# Patient Record
Sex: Female | Born: 1975
Health system: Southern US, Community
[De-identification: ages and names within clinical notes are randomized; demographics above are authoritative.]

## PROBLEM LIST (undated history)

## (undated) DIAGNOSIS — Z9889 Other specified postprocedural states: Secondary | ICD-10-CM

## (undated) DIAGNOSIS — Z90710 Acquired absence of both cervix and uterus: Secondary | ICD-10-CM

## (undated) DIAGNOSIS — R112 Nausea with vomiting, unspecified: Secondary | ICD-10-CM

## (undated) DIAGNOSIS — Z98891 History of uterine scar from previous surgery: Secondary | ICD-10-CM

## (undated) HISTORY — PX: ABDOMINAL HYSTERECTOMY: SHX81

---

## 2018-08-10 DIAGNOSIS — Z23 Encounter for immunization: Secondary | ICD-10-CM | POA: Diagnosis not present

## 2018-08-10 DIAGNOSIS — Z13220 Encounter for screening for lipoid disorders: Secondary | ICD-10-CM | POA: Diagnosis not present

## 2018-08-10 DIAGNOSIS — Z0000 Encounter for general adult medical examination without abnormal findings: Secondary | ICD-10-CM | POA: Diagnosis not present

## 2018-08-10 DIAGNOSIS — E669 Obesity, unspecified: Secondary | ICD-10-CM | POA: Diagnosis not present

## 2018-11-11 DIAGNOSIS — T7840XA Allergy, unspecified, initial encounter: Secondary | ICD-10-CM | POA: Diagnosis not present

## 2018-11-11 DIAGNOSIS — R21 Rash and other nonspecific skin eruption: Secondary | ICD-10-CM | POA: Diagnosis not present

## 2020-03-21 ENCOUNTER — Ambulatory Visit (INDEPENDENT_AMBULATORY_CARE_PROVIDER_SITE_OTHER): Payer: 59 | Admitting: Podiatry

## 2020-03-21 ENCOUNTER — Encounter: Payer: Self-pay | Admitting: Podiatry

## 2020-03-21 ENCOUNTER — Other Ambulatory Visit: Payer: Self-pay

## 2020-03-21 DIAGNOSIS — L600 Ingrowing nail: Secondary | ICD-10-CM | POA: Diagnosis not present

## 2020-03-21 MED ORDER — NEOMYCIN-POLYMYXIN-HC 1 % OT SOLN
OTIC | 1 refills | Status: DC
Start: 2020-03-21 — End: 2020-11-19

## 2020-03-21 NOTE — Patient Instructions (Signed)

## 2020-03-21 NOTE — Progress Notes (Signed)
  Subjective:  Patient ID: Brittney Zimmerman, female    DOB: 1975-10-14,  MRN: [id number] HPI Chief Complaint  Patient presents with  . Toe Pain    Hallux right - medial border, tender, red, swollen intermittently x years, worsened in the last 2 weeks, been trimming and using neosporin  . New Patient (Initial Visit)    44 y.o. female presents with the above complaint.   ROS: Denies fever chills nausea vomiting muscle aches pains calf pain back pain chest pain shortness of breath.  No past medical history on file.   Current Outpatient Medications:  .  NEOMYCIN-POLYMYXIN-HYDROCORTISONE (CORTISPORIN) 1 % SOLN OTIC solution, Apply 1-2 drops to toe BID after soaking, Disp: 10 mL, Rfl: 1  No Known Allergies Review of Systems Objective:  There were no vitals filed for this visit.  General: Well developed, nourished, in no acute distress, alert and oriented x3   Dermatological: Skin is warm, dry and supple bilateral. Nails x 10 are well maintained; remaining integument appears unremarkable at this time. There are no open sores, no preulcerative lesions, no rash or signs of infection present.  Ingrown toenail tibial border hallux right.  Dried abscesses noted to the distal medial aspect.  Moderate erythema no cellulitis drainage or odor.  Vascular: Dorsalis Pedis artery and Posterior Tibial artery pedal pulses are 2/4 bilateral with immedate capillary fill time. Pedal hair growth present. No varicosities and no lower extremity edema present bilateral.   Neruologic: Grossly intact via light touch bilateral. Vibratory intact via tuning fork bilateral. Protective threshold with Semmes Wienstein monofilament intact to all pedal sites bilateral. Patellar and Achilles deep tendon reflexes 2+ bilateral. No Babinski or clonus noted bilateral.   Musculoskeletal: No gross boney pedal deformities bilateral. No pain, crepitus, or limitation noted with foot and ankle range of motion bilateral. Muscular  strength 5/5 in all groups tested bilateral.  Gait: Unassisted, Nonantalgic.    Radiographs:  None taken  Assessment & Plan:   Assessment: Ingrown nail tibial border hallux right  Plan: Chemical matricectomy was performed today after local anesthetic was administered.  She tolerated procedure well without complications.  She was given both oral and written wound instructions for care and soaking of the toe as well as a prescription for Corticosporin otic will follow up with me in 2 weeks.     Niranjan Rufener T. [place], [place]

## 2020-04-04 ENCOUNTER — Ambulatory Visit (INDEPENDENT_AMBULATORY_CARE_PROVIDER_SITE_OTHER): Payer: 59 | Admitting: Podiatry

## 2020-04-04 ENCOUNTER — Other Ambulatory Visit: Payer: Self-pay

## 2020-04-04 DIAGNOSIS — L600 Ingrowing nail: Secondary | ICD-10-CM

## 2020-04-04 NOTE — Progress Notes (Signed)
She presents today for follow-up of her matrixectomy right hallux.  States that she has been soaking Epson salts and warm water.  Objective: Vital signs are stable alert oriented x3.  There is no erythema edema cellulitis drainage or odor.  Assessment: Well-healing surgical toe.  Plan: Follow-up with me on an as-needed basis.

## 2020-04-08 ENCOUNTER — Emergency Department: Payer: 59

## 2020-04-08 ENCOUNTER — Other Ambulatory Visit: Payer: Self-pay

## 2020-04-08 ENCOUNTER — Emergency Department
Admission: EM | Admit: 2020-04-08 | Discharge: 2020-04-08 | Disposition: A | Discharge status: Home / Self Care | Payer: 59 | Attending: Emergency Medicine | Admitting: Emergency Medicine

## 2020-04-08 DIAGNOSIS — K8020 Calculus of gallbladder without cholecystitis without obstruction: Secondary | ICD-10-CM | POA: Insufficient documentation

## 2020-04-08 DIAGNOSIS — R1011 Right upper quadrant pain: Secondary | ICD-10-CM

## 2020-04-08 DIAGNOSIS — K8050 Calculus of bile duct without cholangitis or cholecystitis without obstruction: Secondary | ICD-10-CM | POA: Diagnosis not present

## 2020-04-08 LAB — CBC
HCT: 37.8 % (ref 36.0–46.0)
Hemoglobin: 13.2 g/dL (ref 12.0–15.0)
MCH: 28.4 pg (ref 26.0–34.0)
MCHC: 34.9 g/dL (ref 30.0–36.0)
MCV: 81.3 fL (ref 80.0–100.0)
Platelets: 227 10*3/uL (ref 150–400)
RBC: 4.65 MIL/uL (ref 3.87–5.11)
RDW: 13.5 % (ref 11.5–15.5)
WBC: 7.5 10*3/uL (ref 4.0–10.5)
nRBC: 0 % (ref 0.0–0.2)

## 2020-04-08 LAB — COMPREHENSIVE METABOLIC PANEL
ALT: 16 U/L (ref 0–44)
AST: 19 U/L (ref 15–41)
Albumin: 4.1 g/dL (ref 3.5–5.0)
Alkaline Phosphatase: 56 U/L (ref 38–126)
Anion gap: 9 (ref 5–15)
BUN: 10 mg/dL (ref 6–20)
CO2: 23 mmol/L (ref 22–32)
Calcium: 8.7 mg/dL — ABNORMAL LOW (ref 8.9–10.3)
Chloride: 107 mmol/L (ref 98–111)
Creatinine, Ser: 0.66 mg/dL (ref 0.44–1.00)
GFR calc Af Amer: >60 mL/min (ref >60)
GFR calc non Af Amer: >60 mL/min (ref >60)
Glucose, Bld: 114 mg/dL — ABNORMAL HIGH (ref 70–99)
Potassium: 3.7 mmol/L (ref 3.5–5.1)
Sodium: 139 mmol/L (ref 135–145)
Total Bilirubin: 0.6 mg/dL (ref 0.3–1.2)
Total Protein: 7.8 g/dL (ref 6.5–8.1)

## 2020-04-08 LAB — LIPASE, BLOOD: Lipase: 24 U/L (ref 11–51)

## 2020-04-08 MED ORDER — ONDANSETRON 4 MG PO TBDP
4.0000 mg | ORAL_TABLET | Freq: Three times a day (TID) | ORAL | 0 refills | Status: DC | PRN
Start: 2020-04-08 — End: 2020-11-19

## 2020-04-08 MED ORDER — OXYCODONE-ACETAMINOPHEN 5-325 MG PO TABS
1.0000 | ORAL_TABLET | Freq: Once | ORAL | Status: AC
  Administered 2020-04-08: 1 via ORAL
  Filled 2020-04-08: qty 1

## 2020-04-08 MED ORDER — OXYCODONE-ACETAMINOPHEN 5-325 MG PO TABS: 1.0000 | ORAL_TABLET | ORAL | 0 refills | Status: DC | PRN

## 2020-04-08 MED ORDER — ONDANSETRON 4 MG PO TBDP
4.0000 mg | ORAL_TABLET | Freq: Once | ORAL | Status: AC
  Administered 2020-04-08: 4 mg via ORAL
  Filled 2020-04-08: qty 1

## 2020-04-08 NOTE — Discharge Instructions (Addendum)
1. Take medicines as needed for pain & nausea (Percocet/Zofran #30). 2. Clear liquids x 12 hours, then bland diet x 1 week, then slowly advance diet as tolerated. Avoid fatty, greasy, spicy foods and drinks. 3. Return to the ER for worsening symptoms, persistent vomiting, fever, difficulty breathing or other concerns.  

## 2020-04-08 NOTE — ED Notes (Signed)
Reviewed discharge instructions, follow-up care, and prescriptions with patient. Patient verbalized understanding of all information reviewed. Patient stable, with no distress noted at this time.    

## 2020-04-08 NOTE — ED Triage Notes (Signed)
Pt with right upper quadrant pain and vomiting that began at [place]. Pt denies fever or diarrhea. Pt states last bm two hours ago.

## 2020-04-08 NOTE — ED Provider Notes (Signed)
[facility] Emergency Department Provider Note   ____________________________________________   First MD Initiated Contact with Patient 04/08/20 [id number]     (approximate)  I have reviewed the triage vital signs and the nursing notes.   HISTORY  Chief Complaint Abdominal Pain    HPI Brittney Zimmerman is a 44 y.o. female who presents to the ED from home with a chief complaint of right upper quadrant abdominal pain and vomiting which began approximately 0030.  Patient had buttered popcorn and pizza for dinner.  Awoke with the above symptoms.  Denies fever, chills, chest pain, shortness of breath, dysuria or diarrhea.  Denies recent travel or trauma.  No similar symptoms previously.      Past medical history None  There are no problems to display for this patient.    Prior to Admission medications   Medication Sig Start Date End Date Taking? Authorizing Provider  NEOMYCIN-POLYMYXIN-HYDROCORTISONE (CORTISPORIN) 1 % SOLN OTIC solution Apply 1-2 drops to toe BID after soaking 03/21/20   Hyatt, Max T, DPM  ondansetron (ZOFRAN ODT) 4 MG disintegrating tablet Take 1 tablet (4 mg total) by mouth every 8 (eight) hours as needed for nausea or vomiting. 04/08/20   Paulette Blanch, MD  oxyCODONE-acetaminophen (PERCOCET/ROXICET) 5-325 MG tablet Take 1 tablet by mouth every 4 (four) hours as needed for severe pain. 04/08/20   Paulette Blanch, MD    Allergies Patient has no known allergies.  No family history on file.  Social History Social History   Tobacco Use   Smoking status: Never Smoker   Smokeless tobacco: Never Used  Substance Use Topics   Alcohol use: Not Currently   Drug use: Not on file    Review of Systems  Constitutional: No fever/chills Eyes: No visual changes. ENT: No sore throat. Cardiovascular: Denies chest pain. Respiratory: Denies shortness of breath. Gastrointestinal: Positive for right upper quadrant abdominal pain, nausea and vomiting.   No diarrhea.  No constipation. Genitourinary: Negative for dysuria. Musculoskeletal: Negative for back pain. Skin: Negative for rash. Neurological: Negative for headaches, focal weakness or numbness.   ____________________________________________   PHYSICAL EXAM:  VITAL SIGNS: ED Triage Vitals  Enc Vitals Group     BP 04/08/20 0249 (!) 150/85     Pulse Rate 04/08/20 0249 74     Resp 04/08/20 0249 20     Temp 04/08/20 0249 98 F (36.7 C)     Temp Source 04/08/20 0249 Oral     SpO2 04/08/20 0249 97 %     Weight 04/08/20 0250 250 lb (113.4 kg)     Height 04/08/20 0250 5\' 4"  (1.626 m)     Head Circumference --      Peak Flow --      Pain Score 04/08/20 0250 7     Pain Loc --      Pain Edu? --      Excl. in [place]? --     Constitutional: Alert and oriented. Well appearing and in no acute distress. Eyes: Conjunctivae are normal. PERRL. EOMI. Head: Atraumatic. Nose: No congestion/rhinnorhea. Mouth/Throat: Mucous membranes are moist.   Neck: No stridor.   Cardiovascular: Normal rate, regular rhythm. Grossly normal heart sounds.  Good peripheral circulation. Respiratory: Normal respiratory effort.  No retractions. Lungs CTAB. Gastrointestinal: Soft and minimally tender to palpation right upper quadrant without rebound or guarding. No distention. No abdominal bruits. No CVA tenderness. Musculoskeletal: No lower extremity tenderness nor edema.  No joint effusions. Neurologic:  Normal speech and language.  No gross focal neurologic deficits are appreciated. No gait instability. Skin:  Skin is warm, dry and intact. No rash noted. Psychiatric: Mood and affect are normal. Speech and behavior are normal.  ____________________________________________   LABS (all labs ordered are listed, but only abnormal results are displayed)  Labs Reviewed  COMPREHENSIVE METABOLIC PANEL - Abnormal; Notable for the following components:      Result Value   Glucose, Bld 114 (*)    Calcium 8.7 (*)     All other components within normal limits  LIPASE, BLOOD  CBC  URINALYSIS, COMPLETE (UACMP) WITH MICROSCOPIC   ____________________________________________  EKG  ED ECG REPORT I, Godwin Tedesco J, the attending physician, personally viewed and interpreted this ECG.   Date: 04/08/2020  EKG Time: 0254  Rate: 69  Rhythm: normal EKG, normal sinus rhythm  Axis: Normal  Intervals:none  ST&T Change: Nonspecific  ____________________________________________  RADIOLOGY  ED MD interpretation: Cholelithiasis without cholecystitis or biliary ductal dilatation  Official radiology report(s): US Abdomen Limited  Result Date: 04/08/2020 CLINICAL DATA:  Right upper quadrant pain and emesis EXAM: ULTRASOUND ABDOMEN LIMITED RIGHT UPPER QUADRANT COMPARISON:  None. FINDINGS: Gallbladder: Gallbladder contains multiple echogenic shadowing gallstones and more hypoechoic biliary sludge. Largest gallstone measures up to 1.2 cm in maximal diameter. No significant gallbladder wall thickening or pericholecystic fluid. Sonographic Percell Miller sign is reportedly negative. Common bile duct: Diameter: 4.5 mm, nondilated Liver: Mildly increased hepatic echogenicity with loss of definition of the portal triads compatible with hepatic steatosis. Portal vein is patent on color Doppler imaging with normal direction of blood flow towards the liver. Other: None. IMPRESSION: Cholelithiasis. No sonographic evidence of acute cholecystitis or biliary ductal dilatation. Slightly increased hepatic echogenicity, most likely reflective of mild hepatic steatosis. Electronically Signed   By: Lovena Le M.D.   On: 04/08/2020 03:24    ____________________________________________   PROCEDURES  Procedure(s) performed (including Critical Care):  Procedures   ____________________________________________   INITIAL IMPRESSION / ASSESSMENT AND PLAN / ED COURSE  As part of my medical decision making, I reviewed the following data within  the [place] History obtained from family, Nursing notes reviewed and incorporated, Labs reviewed, Old chart reviewed, Radiograph reviewed, Notes from prior ED visits and [place] Controlled Substance Database     Brittney Zimmerman was evaluated in Emergency Department on 04/08/2020 for the symptoms described in the history of present illness. She was evaluated in the context of the global COVID-19 pandemic, which necessitated consideration that the patient might be at risk for infection with the SARS-CoV-2 virus that causes COVID-19. Institutional protocols and algorithms that pertain to the evaluation of patients at risk for COVID-19 are in a state of rapid change based on information released by regulatory bodies including the CDC and federal and state organizations. These policies and algorithms were followed during the patient's care in the ED.    44 year old female presenting with upper abdominal pain and vomiting. Differential diagnosis includes, but is not limited to, biliary disease (biliary colic, acute cholecystitis, cholangitis, choledocholithiasis, etc), intrathoracic causes for epigastric abdominal pain including ACS, gastritis, duodenitis, pancreatitis, small bowel or large bowel obstruction, abdominal aortic aneurysm, hernia, and ulcer(s).  Laboratory results demonstrate normal LFTs and lipase.  Ultrasound demonstrates cholelithiasis without cholecystitis.  Patient pain-free at present.  Will discharge home on Percocet and Zofran to use as needed, gallbladder diet and surgery follow-up.  Strict return precautions given.  Patient and spouse verbalized understanding agree with plan of care.      ____________________________________________  FINAL CLINICAL IMPRESSION(S) / ED DIAGNOSES  Final diagnoses:  Biliary colic  Calculus of gallbladder without cholecystitis without obstruction  Right upper quadrant abdominal pain     ED Discharge Orders         Ordered     oxyCODONE-acetaminophen (PERCOCET/ROXICET) 5-325 MG tablet  Every 4 hours PRN     Discontinue  Reprint     04/08/20 0553    ondansetron (ZOFRAN ODT) 4 MG disintegrating tablet  Every 8 hours PRN     Discontinue  Reprint     04/08/20 0553           Note:  This document was prepared using Dragon voice recognition software and may include unintentional dictation errors.   Paulette Blanch, MD 04/08/20 [phone]

## 2020-07-24 ENCOUNTER — Other Ambulatory Visit: Payer: Self-pay | Admitting: Family Medicine

## 2020-07-24 DIAGNOSIS — Z1231 Encounter for screening mammogram for malignant neoplasm of breast: Secondary | ICD-10-CM

## 2020-08-30 ENCOUNTER — Other Ambulatory Visit: Payer: Self-pay

## 2020-08-30 ENCOUNTER — Ambulatory Visit
Admission: RE | Admit: 2020-08-30 | Discharge: 2020-08-30 | Disposition: A | Discharge status: Home / Self Care | Payer: 59 | Source: Ambulatory Visit | Attending: Family Medicine | Admitting: Family Medicine

## 2020-08-30 DIAGNOSIS — Z1231 Encounter for screening mammogram for malignant neoplasm of breast: Secondary | ICD-10-CM | POA: Diagnosis present

## 2020-09-05 ENCOUNTER — Other Ambulatory Visit: Payer: Self-pay | Admitting: Family Medicine

## 2020-09-10 ENCOUNTER — Other Ambulatory Visit: Payer: Self-pay | Admitting: Family Medicine

## 2020-09-10 DIAGNOSIS — R928 Other abnormal and inconclusive findings on diagnostic imaging of breast: Secondary | ICD-10-CM

## 2020-09-10 DIAGNOSIS — N6310 Unspecified lump in the right breast, unspecified quadrant: Secondary | ICD-10-CM

## 2020-09-18 ENCOUNTER — Ambulatory Visit
Admission: RE | Admit: 2020-09-18 | Discharge: 2020-09-18 | Disposition: A | Discharge status: Home / Self Care | Payer: 59 | Source: Ambulatory Visit | Attending: Family Medicine | Admitting: Family Medicine

## 2020-09-18 ENCOUNTER — Other Ambulatory Visit: Payer: Self-pay

## 2020-09-18 DIAGNOSIS — R928 Other abnormal and inconclusive findings on diagnostic imaging of breast: Secondary | ICD-10-CM

## 2020-09-18 DIAGNOSIS — N6310 Unspecified lump in the right breast, unspecified quadrant: Secondary | ICD-10-CM | POA: Insufficient documentation

## 2020-09-20 ENCOUNTER — Other Ambulatory Visit: Payer: Self-pay | Admitting: Family Medicine

## 2020-09-20 DIAGNOSIS — N6320 Unspecified lump in the left breast, unspecified quadrant: Secondary | ICD-10-CM

## 2020-09-20 DIAGNOSIS — R928 Other abnormal and inconclusive findings on diagnostic imaging of breast: Secondary | ICD-10-CM

## 2020-09-24 ENCOUNTER — Other Ambulatory Visit: Payer: Self-pay | Admitting: Family Medicine

## 2020-09-24 DIAGNOSIS — N6310 Unspecified lump in the right breast, unspecified quadrant: Secondary | ICD-10-CM

## 2020-09-24 DIAGNOSIS — R928 Other abnormal and inconclusive findings on diagnostic imaging of breast: Secondary | ICD-10-CM

## 2020-09-27 ENCOUNTER — Ambulatory Visit
Admission: RE | Admit: 2020-09-27 | Discharge: 2020-09-27 | Disposition: A | Discharge status: Home / Self Care | Payer: 59 | Source: Ambulatory Visit | Attending: Family Medicine | Admitting: Family Medicine

## 2020-09-27 ENCOUNTER — Other Ambulatory Visit: Payer: 59

## 2020-09-27 ENCOUNTER — Ambulatory Visit: Payer: 59

## 2020-09-27 ENCOUNTER — Other Ambulatory Visit: Payer: Self-pay

## 2020-09-27 DIAGNOSIS — N6310 Unspecified lump in the right breast, unspecified quadrant: Secondary | ICD-10-CM

## 2020-09-27 DIAGNOSIS — R928 Other abnormal and inconclusive findings on diagnostic imaging of breast: Secondary | ICD-10-CM

## 2020-09-27 HISTORY — PX: BREAST BIOPSY: SHX20

## 2020-09-28 LAB — SURGICAL PATHOLOGY

## 2020-10-03 ENCOUNTER — Telehealth: Payer: Self-pay

## 2020-10-03 NOTE — Telephone Encounter (Signed)
I spoke with patient and we are referring to [facility] location.  All paperwork was faxed to the [place] office.  They will call patient with time/date.  Patient aware.

## 2020-10-18 ENCOUNTER — Ambulatory Visit: Payer: Self-pay | Admitting: Surgery

## 2020-10-18 DIAGNOSIS — N6489 Other specified disorders of breast: Secondary | ICD-10-CM

## 2020-10-22 ENCOUNTER — Other Ambulatory Visit: Payer: Self-pay | Admitting: Surgery

## 2020-10-22 DIAGNOSIS — N6489 Other specified disorders of breast: Secondary | ICD-10-CM

## 2020-11-16 ENCOUNTER — Encounter (HOSPITAL_BASED_OUTPATIENT_CLINIC_OR_DEPARTMENT_OTHER): Payer: Self-pay | Admitting: Surgery

## 2020-11-19 ENCOUNTER — Other Ambulatory Visit: Payer: Self-pay

## 2020-11-19 ENCOUNTER — Encounter (HOSPITAL_BASED_OUTPATIENT_CLINIC_OR_DEPARTMENT_OTHER): Payer: Self-pay | Admitting: Surgery

## 2020-11-20 ENCOUNTER — Other Ambulatory Visit
Admission: RE | Admit: 2020-11-20 | Discharge: 2020-11-20 | Disposition: A | Discharge status: Home / Self Care | Payer: 59 | Source: Ambulatory Visit | Attending: Family Medicine | Admitting: Family Medicine

## 2020-11-20 DIAGNOSIS — Z01812 Encounter for preprocedural laboratory examination: Secondary | ICD-10-CM | POA: Insufficient documentation

## 2020-11-20 DIAGNOSIS — Z20822 Contact with and (suspected) exposure to covid-19: Secondary | ICD-10-CM | POA: Insufficient documentation

## 2020-11-20 LAB — SARS CORONAVIRUS 2 (TAT 6-24 HRS): SARS Coronavirus 2: NEGATIVE

## 2020-11-22 ENCOUNTER — Other Ambulatory Visit: Payer: Self-pay

## 2020-11-22 ENCOUNTER — Ambulatory Visit
Admission: RE | Admit: 2020-11-22 | Discharge: 2020-11-22 | Disposition: A | Discharge status: Home / Self Care | Payer: 59 | Source: Ambulatory Visit | Attending: Surgery | Admitting: Surgery

## 2020-11-22 DIAGNOSIS — N6489 Other specified disorders of breast: Secondary | ICD-10-CM

## 2020-11-22 NOTE — H&P (Signed)
Brittney Zimmerman Location: [place] Office Patient #: [phone] DOB: Mar 07, 1976 Married / Language: English / Race: White Female   History of Present Illness  The patient is a 45 year old female who presents with a breast mass. The patient is being seen for initial consultation for suspected neoplasm in the right breast. she underwent a core biopsy on September 27, 2020 and in the mass at the 12 o'clock position 8 cm from the nipple and ultrasound-guided biopsy showed a complex sclerosing lesion with multifocal cyst formation and on the common at they felt that this may not be representative and a limited sampling suggesting did excision was in order. The coil shaped clip was deployed in the mass at the 12 o'clock position and a heart-shaped clip was deployed at the 1 o'clock position. The 1 o'clock position was close to the nipple and showed focal sputum angiomatous stromal hyperplasia   Past Surgical History Brittney Zimmerman, CMA; 10/18/2020 9:16 AM) Breast Biopsy  Right. Cesarean Section - Multiple  Hysterectomy (not due to cancer) - Complete   Diagnostic Studies History Brittney Zimmerman, CMA; 10/18/2020 9:16 AM) Colonoscopy  never Mammogram  within last year Pap Smear  >5 years ago  Allergies Brittney Zimmerman, CMA; 10/18/2020 9:17 AM) No Known Drug Allergies  [10/18/2020]:  Medication History Brittney Zimmerman, CMA; 10/18/2020 9:17 AM) No Current Medications Medications Reconciled  Social History Brittney Zimmerman, CMA; 10/18/2020 9:16 AM) Alcohol use  Occasional alcohol use. No caffeine use  No drug use  Tobacco use  Never smoker.  Family History Brittney Zimmerman, CMA; 10/18/2020 9:16 AM) Alcohol Abuse  Family Members In General. Cerebrovascular Accident  Family Members In General. Depression  Family Members In General, Mother. Diabetes Mellitus  Family Members In General. Heart Disease  Family Members In General, Mother. Heart disease in female family member before age 43   Heart disease in female family member before age 20  Hypertension  Mother. Migraine Headache  Mother, Sister. Prostate Cancer  Family Members In General.  Pregnancy / Birth History Brittney Zimmerman, CMA; 10/18/2020 9:16 AM) Age at menarche  24 years. Contraceptive History  Depo-provera, Oral contraceptives. Gravida  5 Maternal age  31-20 Para  80  Other Problems Brittney Zimmerman, CMA; 10/18/2020 9:16 AM) Cholelithiasis  General anesthesia - complications  High blood pressure  Lump In Breast  Oophorectomy     Review of Systems (Brittney Zimmerman CMA; 10/18/2020 9:16 AM) General Not Present- Appetite Loss, Chills, Fatigue, Fever, Night Sweats, Weight Gain and Weight Loss. Skin Not Present- Change in Wart/Mole, Dryness, Hives, Jaundice, New Lesions, Non-Healing Wounds, Rash and Ulcer. HEENT Present- Wears glasses/contact lenses. Not Present- Earache, Hearing Loss, Hoarseness, Nose Bleed, Oral Ulcers, Ringing in the Ears, Seasonal Allergies, Sinus Pain, Sore Throat, Visual Disturbances and Yellow Eyes. Respiratory Not Present- Bloody sputum, Chronic Cough, Difficulty Breathing, Snoring and Wheezing. Breast Present- Breast Mass. Not Present- Breast Pain, Nipple Discharge and Skin Changes. Cardiovascular Not Present- Chest Pain, Difficulty Breathing Lying Down, Leg Cramps, Palpitations, Rapid Heart Rate, Shortness of Breath and Swelling of Extremities. Gastrointestinal Not Present- Abdominal Pain, Bloating, Bloody Stool, Change in Bowel Habits, Chronic diarrhea, Constipation, Difficulty Swallowing, Excessive gas, Gets full quickly at meals, Hemorrhoids, Indigestion, Nausea, Rectal Pain and Vomiting. Female Genitourinary Not Present- Frequency, Nocturia, Painful Urination, Pelvic Pain and Urgency. Musculoskeletal Not Present- Back Pain, Joint Pain, Joint Stiffness, Muscle Pain, Muscle Weakness and Swelling of Extremities. Neurological Not Present- Decreased Memory, Fainting, Headaches,  Numbness, Seizures, Tingling, Tremor, Trouble walking and Weakness. Psychiatric Not Present-  Anxiety, Bipolar, Change in Sleep Pattern, Depression, Fearful and Frequent crying. Endocrine Present- Hair Changes. Not Present- Cold Intolerance, Excessive Hunger, Heat Intolerance, Hot flashes and New Diabetes. Hematology Not Present- Blood Thinners, Easy Bruising, Excessive bleeding, Gland problems, HIV and Persistent Infections.  Vitals Weight: 261.4 lb Height: 64.5in Body Surface Area: 2.2 m Body Mass Index: 44.18 kg/m   WFNAD HEENT unremarkable Chest clear Heart SR Breast:no dimpling Note: large breasted white female with 2 little areas of biopsy in the right breast. 1 near the areolar margin at the 1:00 side and then about 8 cm from the nipple representing the 12:00 site. There is no palpable mass to go along with that. There is no tenderness in the right axilla.  Assessment & Plan  COMPLEX SCLEROSING LESION OF RIGHT BREAST (N64.89) Impression: this area is at the 12:00 8 cm from the nipple with a coil-shaped clip deployed. This will need seed localization and right breast biopsy. Procedure explained to her again on the day of surgery.  Matt B. Hassell Done, MD, FACS

## 2020-11-23 ENCOUNTER — Ambulatory Visit (HOSPITAL_BASED_OUTPATIENT_CLINIC_OR_DEPARTMENT_OTHER)
Admission: RE | Admit: 2020-11-23 | Discharge: 2020-11-23 | Disposition: A | Discharge status: Home / Self Care | Payer: 59 | Attending: Surgery | Admitting: Surgery

## 2020-11-23 ENCOUNTER — Ambulatory Visit
Admission: RE | Admit: 2020-11-23 | Discharge: 2020-11-23 | Disposition: A | Discharge status: Home / Self Care | Payer: 59 | Source: Ambulatory Visit | Attending: Surgery | Admitting: Surgery

## 2020-11-23 ENCOUNTER — Encounter (HOSPITAL_BASED_OUTPATIENT_CLINIC_OR_DEPARTMENT_OTHER): Payer: Self-pay | Admitting: Surgery

## 2020-11-23 ENCOUNTER — Encounter (HOSPITAL_BASED_OUTPATIENT_CLINIC_OR_DEPARTMENT_OTHER)
Admission: RE | Disposition: A | Discharge status: Home / Self Care | Payer: Self-pay | Source: Home / Self Care | Attending: Surgery

## 2020-11-23 ENCOUNTER — Ambulatory Visit (HOSPITAL_BASED_OUTPATIENT_CLINIC_OR_DEPARTMENT_OTHER): Payer: 59 | Admitting: Certified Registered"

## 2020-11-23 ENCOUNTER — Other Ambulatory Visit: Payer: Self-pay

## 2020-11-23 DIAGNOSIS — Z8042 Family history of malignant neoplasm of prostate: Secondary | ICD-10-CM | POA: Diagnosis not present

## 2020-11-23 DIAGNOSIS — Z823 Family history of stroke: Secondary | ICD-10-CM | POA: Diagnosis not present

## 2020-11-23 DIAGNOSIS — N6310 Unspecified lump in the right breast, unspecified quadrant: Secondary | ICD-10-CM | POA: Diagnosis present

## 2020-11-23 DIAGNOSIS — Z8249 Family history of ischemic heart disease and other diseases of the circulatory system: Secondary | ICD-10-CM | POA: Insufficient documentation

## 2020-11-23 DIAGNOSIS — Z820 Family history of epilepsy and other diseases of the nervous system: Secondary | ICD-10-CM | POA: Insufficient documentation

## 2020-11-23 DIAGNOSIS — D241 Benign neoplasm of right breast: Secondary | ICD-10-CM | POA: Insufficient documentation

## 2020-11-23 DIAGNOSIS — N6489 Other specified disorders of breast: Secondary | ICD-10-CM

## 2020-11-23 HISTORY — PX: BREAST EXCISIONAL BIOPSY: SUR124

## 2020-11-23 HISTORY — PX: RADIOACTIVE SEED GUIDED EXCISIONAL BREAST BIOPSY: SHX6490

## 2020-11-23 HISTORY — DX: Nausea with vomiting, unspecified: R11.2

## 2020-11-23 HISTORY — DX: History of uterine scar from previous surgery: Z98.891

## 2020-11-23 HISTORY — DX: Acquired absence of both cervix and uterus: Z90.710

## 2020-11-23 HISTORY — DX: Nausea with vomiting, unspecified: Z98.890

## 2020-11-23 SURGERY — RADIOACTIVE SEED GUIDED BREAST BIOPSY
Anesthesia: General | Site: Breast | Laterality: Right

## 2020-11-23 MED ORDER — 0.9 % SODIUM CHLORIDE (POUR BTL) OPTIME
TOPICAL | Status: DC | PRN
  Administered 2020-11-23: 1000 mL

## 2020-11-23 MED ORDER — CEFAZOLIN SODIUM-DEXTROSE 2-4 GM/100ML-% IV SOLN
2.0000 g | INTRAVENOUS | Status: AC
  Administered 2020-11-23: 2 g via INTRAVENOUS

## 2020-11-23 MED ORDER — HYDROCODONE-ACETAMINOPHEN 5-325 MG PO TABS: 1.0000 | ORAL_TABLET | Freq: Four times a day (QID) | ORAL | 0 refills | Status: DC | PRN

## 2020-11-23 MED ORDER — FENTANYL CITRATE (PF) 100 MCG/2ML IJ SOLN
INTRAMUSCULAR | Status: DC | PRN
  Administered 2020-11-23 (×2): 25 ug via INTRAVENOUS

## 2020-11-23 MED ORDER — LACTATED RINGERS IV SOLN: INTRAVENOUS | Status: DC

## 2020-11-23 MED ORDER — AMISULPRIDE (ANTIEMETIC) 5 MG/2ML IV SOLN
10.0000 mg | Freq: Once | INTRAVENOUS | Status: AC | PRN
  Administered 2020-11-23: 10 mg via INTRAVENOUS

## 2020-11-23 MED ORDER — MIDAZOLAM HCL 2 MG/2ML IJ SOLN
INTRAMUSCULAR | Status: AC
  Filled 2020-11-23: qty 2

## 2020-11-23 MED ORDER — OXYCODONE HCL 5 MG PO TABS: 5.0000 mg | ORAL_TABLET | Freq: Once | ORAL | Status: DC | PRN

## 2020-11-23 MED ORDER — BUPIVACAINE HCL 0.25 % IJ SOLN
INTRAMUSCULAR | Status: DC | PRN
  Administered 2020-11-23: 9 mL

## 2020-11-23 MED ORDER — ONDANSETRON HCL 4 MG/2ML IJ SOLN
INTRAMUSCULAR | Status: DC | PRN
  Administered 2020-11-23: 4 mg via INTRAVENOUS

## 2020-11-23 MED ORDER — DEXAMETHASONE SODIUM PHOSPHATE 10 MG/ML IJ SOLN
INTRAMUSCULAR | Status: AC
  Filled 2020-11-23: qty 1

## 2020-11-23 MED ORDER — MIDAZOLAM HCL 5 MG/5ML IJ SOLN
INTRAMUSCULAR | Status: DC | PRN
  Administered 2020-11-23: 2 mg via INTRAVENOUS

## 2020-11-23 MED ORDER — MEPERIDINE HCL 25 MG/ML IJ SOLN: 6.2500 mg | INTRAMUSCULAR | Status: DC | PRN

## 2020-11-23 MED ORDER — AMISULPRIDE (ANTIEMETIC) 5 MG/2ML IV SOLN
INTRAVENOUS | Status: AC
  Filled 2020-11-23: qty 4

## 2020-11-23 MED ORDER — OXYCODONE HCL 5 MG/5ML PO SOLN: 5.0000 mg | Freq: Once | ORAL | Status: DC | PRN

## 2020-11-23 MED ORDER — CHLORHEXIDINE GLUCONATE CLOTH 2 % EX PADS: 6.0000 | MEDICATED_PAD | Freq: Once | CUTANEOUS | Status: DC

## 2020-11-23 MED ORDER — PROMETHAZINE HCL 25 MG/ML IJ SOLN: 6.2500 mg | INTRAMUSCULAR | Status: DC | PRN

## 2020-11-23 MED ORDER — EPHEDRINE 5 MG/ML INJ
INTRAVENOUS | Status: AC
  Filled 2020-11-23: qty 10

## 2020-11-23 MED ORDER — DEXAMETHASONE SODIUM PHOSPHATE 10 MG/ML IJ SOLN
INTRAMUSCULAR | Status: DC | PRN
  Administered 2020-11-23: 5 mg via INTRAVENOUS

## 2020-11-23 MED ORDER — PROPOFOL 10 MG/ML IV BOLUS
INTRAVENOUS | Status: DC | PRN
  Administered 2020-11-23: 200 mg via INTRAVENOUS

## 2020-11-23 MED ORDER — PROPOFOL 10 MG/ML IV BOLUS
INTRAVENOUS | Status: AC
  Filled 2020-11-23: qty 40

## 2020-11-23 MED ORDER — EPHEDRINE SULFATE 50 MG/ML IJ SOLN
INTRAMUSCULAR | Status: DC | PRN
  Administered 2020-11-23: 5 mg via INTRAVENOUS

## 2020-11-23 MED ORDER — HYDROMORPHONE HCL 1 MG/ML IJ SOLN: 0.2500 mg | INTRAMUSCULAR | Status: DC | PRN

## 2020-11-23 MED ORDER — BUPIVACAINE-EPINEPHRINE (PF) 0.25% -1:200000 IJ SOLN: INTRAMUSCULAR | Status: DC | PRN

## 2020-11-23 MED ORDER — BUPIVACAINE HCL (PF) 0.25 % IJ SOLN
INTRAMUSCULAR | Status: AC
  Filled 2020-11-23: qty 60

## 2020-11-23 MED ORDER — LIDOCAINE HCL (CARDIAC) PF 100 MG/5ML IV SOSY
PREFILLED_SYRINGE | INTRAVENOUS | Status: DC | PRN
  Administered 2020-11-23: 100 mg via INTRAVENOUS

## 2020-11-23 MED ORDER — LIDOCAINE 2% (20 MG/ML) 5 ML SYRINGE
INTRAMUSCULAR | Status: AC
  Filled 2020-11-23: qty 5

## 2020-11-23 MED ORDER — ONDANSETRON HCL 4 MG/2ML IJ SOLN
INTRAMUSCULAR | Status: AC
  Filled 2020-11-23: qty 2

## 2020-11-23 MED ORDER — FENTANYL CITRATE (PF) 100 MCG/2ML IJ SOLN
INTRAMUSCULAR | Status: AC
  Filled 2020-11-23: qty 2

## 2020-11-23 MED ORDER — CEFAZOLIN SODIUM-DEXTROSE 2-4 GM/100ML-% IV SOLN
INTRAVENOUS | Status: AC
  Filled 2020-11-23: qty 100

## 2020-11-23 SURGICAL SUPPLY — 54 items
ADH SKN CLS APL DERMABOND .7 (GAUZE/BANDAGES/DRESSINGS) ×1
APL PRP STRL LF DISP 70% ISPRP (MISCELLANEOUS) ×1
APL SKNCLS STERI-STRIP NONHPOA (GAUZE/BANDAGES/DRESSINGS)
APPLIER CLIP 9.375 MED OPEN (MISCELLANEOUS)
APR CLP MED 9.3 20 MLT OPN (MISCELLANEOUS)
BENZOIN TINCTURE PRP APPL 2/3 (GAUZE/BANDAGES/DRESSINGS) IMPLANT
BINDER BREAST LRG (GAUZE/BANDAGES/DRESSINGS) IMPLANT
BINDER BREAST MEDIUM (GAUZE/BANDAGES/DRESSINGS) IMPLANT
BINDER BREAST XLRG (GAUZE/BANDAGES/DRESSINGS) IMPLANT
BINDER BREAST XXLRG (GAUZE/BANDAGES/DRESSINGS) ×2 IMPLANT
BLADE HEX COATED 2.75 (ELECTRODE) IMPLANT
BLADE SURG 10 STRL SS (BLADE) IMPLANT
BLADE SURG 15 STRL LF DISP TIS (BLADE) ×1 IMPLANT
BLADE SURG 15 STRL SS (BLADE) ×2
CANISTER SUC SOCK COL 7IN (MISCELLANEOUS) IMPLANT
CANISTER SUCT 1200ML W/VALVE (MISCELLANEOUS) ×2 IMPLANT
CHLORAPREP W/TINT 26 (MISCELLANEOUS) ×2 IMPLANT
CLIP APPLIE 9.375 MED OPEN (MISCELLANEOUS) IMPLANT
CLIP VESOCCLUDE SM WIDE 6/CT (CLIP) IMPLANT
COVER BACK TABLE 60X90IN (DRAPES) ×2 IMPLANT
COVER MAYO STAND STRL (DRAPES) ×2 IMPLANT
COVER PROBE W GEL 5X96 (DRAPES) ×2 IMPLANT
COVER WAND RF STERILE (DRAPES) IMPLANT
DECANTER SPIKE VIAL GLASS SM (MISCELLANEOUS) IMPLANT
DERMABOND ADVANCED (GAUZE/BANDAGES/DRESSINGS) ×1
DERMABOND ADVANCED .7 DNX12 (GAUZE/BANDAGES/DRESSINGS) ×1 IMPLANT
DRAPE LAPAROTOMY 100X72 PEDS (DRAPES) ×2 IMPLANT
DRSG PAD ABDOMINAL 8X10 ST (GAUZE/BANDAGES/DRESSINGS) ×2 IMPLANT
ELECT COATED BLADE 2.86 ST (ELECTRODE) ×2 IMPLANT
ELECT REM PT RETURN 9FT ADLT (ELECTROSURGICAL) ×2
ELECTRODE REM PT RTRN 9FT ADLT (ELECTROSURGICAL) ×1 IMPLANT
GAUZE SPONGE 4X4 12PLY STRL LF (GAUZE/BANDAGES/DRESSINGS) IMPLANT
GLOVE SURG ENC MOIS LTX SZ8 (GLOVE) ×2 IMPLANT
GOWN STRL REUS W/ TWL LRG LVL3 (GOWN DISPOSABLE) ×1 IMPLANT
GOWN STRL REUS W/ TWL XL LVL3 (GOWN DISPOSABLE) ×1 IMPLANT
GOWN STRL REUS W/TWL LRG LVL3 (GOWN DISPOSABLE) ×2
GOWN STRL REUS W/TWL XL LVL3 (GOWN DISPOSABLE) ×2
KIT MARKER MARGIN INK (KITS) ×2 IMPLANT
NEEDLE HYPO 25X1 1.5 SAFETY (NEEDLE) ×2 IMPLANT
NS IRRIG 1000ML POUR BTL (IV SOLUTION) IMPLANT
PACK BASIN DAY SURGERY FS (CUSTOM PROCEDURE TRAY) ×2 IMPLANT
PENCIL SMOKE EVACUATOR (MISCELLANEOUS) ×2 IMPLANT
SCRUB TECHNI CARE 4 OZ NO DYE (MISCELLANEOUS) ×2 IMPLANT
SHEET MEDIUM DRAPE 40X70 STRL (DRAPES) ×2 IMPLANT
SLEEVE SCD COMPRESS KNEE MED (MISCELLANEOUS) ×2 IMPLANT
SPONGE LAP 18X18 RF (DISPOSABLE) ×2 IMPLANT
STRIP CLOSURE SKIN 1/4X4 (GAUZE/BANDAGES/DRESSINGS) IMPLANT
SUT MNCRL AB 4-0 PS2 18 (SUTURE) ×2 IMPLANT
SUT VIC AB 4-0 SH 18 (SUTURE) ×2 IMPLANT
SUT VICRYL 3-0 CR8 SH (SUTURE) IMPLANT
TOWEL GREEN STERILE FF (TOWEL DISPOSABLE) ×2 IMPLANT
TRAY FAXITRON CT DISP (TRAY / TRAY PROCEDURE) IMPLANT
TUBE CONNECTING 20X1/4 (TUBING) ×2 IMPLANT
YANKAUER SUCT BULB TIP NO VENT (SUCTIONS) ×2 IMPLANT

## 2020-11-23 NOTE — Discharge Instructions (Signed)
Breast Biopsy, Care After These instructions give you information about caring for yourself after your procedure. Your doctor may also give you more specific instructions. Call your doctor if you have any problems or questions after your procedure. What can I expect after the procedure? After your procedure, it is common to have:  Bruising on your breast.  Numbness, tingling, or pain near your biopsy site. Follow these instructions at home: Medicines  Take over-the-counter and prescription medicines only as told by your doctor.  Do not drive for 24 hours if you were given a medicine to help you relax (sedative) during your procedure.  Do not drink alcohol while taking pain medicine.  Do not drive or use heavy machinery while taking prescription pain medicine. Biopsy site care  Follow instructions from your doctor about how to take care of your cut from surgery (incision) or your puncture area. Make sure you: ? Wash your hands with soap and water before you change your bandage (dressing). If you cannot use soap and water, use hand sanitizer. ? Change your bandage as told by your doctor. ? Leave stitches (sutures), skin glue, or skin tape (adhesive strips) in place. They may need to stay in place for 2 weeks or longer. If tape strips get loose and curl up, you may trim the loose edges. Do not remove tape strips completely unless your doctor says it is okay.  If you have stitches, keep them dry when you take a bath or a shower.  Check your cut or puncture area every day for signs of infection. Check for: ? Redness, swelling, or pain. ? Fluid or blood. ? Warmth. ? Pus or a bad smell.  Protect the biopsy area. Do not let the area get bumped.      Activity  If you had a cut during your procedure, avoid activities that could pull your cut open. These include: ? Stretching. ? Reaching over your head. ? Exercise. ? Sports. ? Lifting anything that weighs more than 3 lb (1.4  kg).  Return to your normal activities as told by your doctor. Ask your doctor what activities are safe for you. Managing pain, stiffness, and swelling If told, put ice on the biopsy site to relieve swelling:  Put ice in a plastic bag.  Place a towel between your skin and the bag.  Leave the ice on for 20 minutes, 2-3 times a day. General instructions  Continue your normal diet.  Wear a good support bra for as long as told by your doctor.  Get checked for extra fluid around your lymph nodes (lymphedema) as often as told by your doctor.  Keep all follow-up visits as told by your doctor. This is important. Contact a doctor if:  You notice any of the following at the biopsy site: ? More redness, swelling, or pain. ? More fluid or blood coming from the site. ? The site feels warm to the touch. ? Pus or a bad smell coming from the site. ? The site breaks open after the stitches or skin tape strips have been removed.  You have a rash.  You have a fever. Get help right away if:  You have more bleeding from the biopsy site. Get help right away if bleeding is more than a small spot.  You have trouble breathing.  You have red streaks around the biopsy site. Summary  After your procedure, it is common to have bruising, numbness, tingling, or pain near the biopsy site.  Do not  drive or use heavy machinery while taking prescription pain medicine.  Wear a good support bra for as long as told by your doctor.  If you had a cut during your procedure, avoid activities that may pull the cut open. Ask your doctor what activities are safe for you. This information is not intended to replace advice given to you by your health care provider. Make sure you discuss any questions you have with your health care provider. Document Revised: 06/11/2020 Document Reviewed: 03/18/2018 Elsevier Patient Education  2021 [place] Instructions  Activity: Get plenty  of rest for the remainder of the day. A responsible individual must stay with you for 24 hours following the procedure.  For the next 24 hours, DO NOT: -Drive a car -Paediatric nurse -Drink alcoholic beverages -Take any medication unless instructed by your physician -Make any legal decisions or sign important papers.  Meals: Start with liquid foods such as gelatin or soup. Progress to regular foods as tolerated. Avoid greasy, spicy, heavy foods. If nausea and/or vomiting occur, drink only clear liquids until the nausea and/or vomiting subsides. Call your physician if vomiting continues.  Special Instructions/Symptoms: Your throat may feel dry or sore from the anesthesia or the breathing tube placed in your throat during surgery. If this causes discomfort, gargle with warm salt water. The discomfort should disappear within 24 hours.

## 2020-11-23 NOTE — Anesthesia Procedure Notes (Signed)
Procedure Name: LMA Insertion Date/Time: 11/23/2020 7:35 AM Performed by: Lavonia Dana, CRNA Pre-anesthesia Checklist: Patient identified, Emergency Drugs available, Suction available and Patient being monitored Patient Re-evaluated:Patient Re-evaluated prior to induction Oxygen Delivery Method: Circle system utilized Preoxygenation: Pre-oxygenation with 100% oxygen Induction Type: IV induction Ventilation: Mask ventilation without difficulty LMA: LMA inserted LMA Size: 4.0 Number of attempts: 1 Airway Equipment and Method: Bite block Placement Confirmation: positive ETCO2 Tube secured with: Tape Dental Injury: Teeth and Oropharynx as per pre-operative assessment

## 2020-11-23 NOTE — Anesthesia Postprocedure Evaluation (Signed)
Anesthesia Post Note  Patient: Letta Casali  Procedure(s) Performed: RIGHT RADIOACTIVE SEED GUIDED BREAST LUMPECTOMY (Right Breast)     Patient location during evaluation: PACU Anesthesia Type: General Level of consciousness: awake and alert Pain management: pain level controlled Vital Signs Assessment: post-procedure vital signs reviewed and stable Respiratory status: spontaneous breathing, nonlabored ventilation and respiratory function stable Cardiovascular status: blood pressure returned to baseline and stable Postop Assessment: no apparent nausea or vomiting Anesthetic complications: no   No complications documented.  Last Vitals:  Vitals:   11/23/20 0928 11/23/20 1001  BP:  (!) 133/92  Pulse: 74 65  Resp: 19 16  Temp:  36.7 C  SpO2: 98% 99%    Last Pain:  Vitals:   11/23/20 0940  TempSrc:   PainSc: 0-No pain                 Lynda Rainwater

## 2020-11-23 NOTE — Op Note (Signed)
Brittney Zimmerman  [birth year]   11/23/2020    PCP:  Alroy Dust, L.Marlou Sa, MD   Surgeon: Kaylyn Lim, MD, FACS  Asst:  none  Anes:  General LMA  Preop Dx: Complex sclerosing lesion in the right breast Postop Dx: Same path pending  Procedure: Radioactive seed guided right breast biopsy Location Surgery: CDS 8 Complications: None noted  EBL:   minimal cc  Drains: none  Description of Procedure:  The patient was taken to OR 8 .  After anesthesia was administered and the patient was prepped  with chloroprep  and a timeout was performed.  The right breast was mapped with the Neoprobe and an incision site was selected.  The incision was made and the lumpectomy performed with the Bovie.  Finger dissection guided the removal of the mass effect in the depths of the incision.  The mass was removed and marked with colors and the Faxitron provided images showing both the marker and the seed.  The wound was irrigated with saline and close deep with 4-0 vicryl and subcut with vicry and then 4-0 Monocryl subcuticular and Dermabond.    The patient tolerated the procedure well and was taken to the PACU in stable condition.     Matt B. Hassell Done, [place], [facility], [place]

## 2020-11-23 NOTE — Interval H&P Note (Signed)
History and Physical Interval Note:  11/23/2020 7:28 AM  Brittney Zimmerman  has presented today for surgery, with the diagnosis of COMPLEX SCLEROSING MASS OF RIGHT BREAST.  The various methods of treatment have been discussed with the patient and family. After consideration of risks, benefits and other options for treatment, the patient has consented to  Procedure(s): RIGHT RADIOACTIVE SEED GUIDED BREAST LUMPECTOMY (Right) as a surgical intervention.  The patient's history has been reviewed, patient examined, no change in status, stable for surgery.  I have reviewed the patient's chart and labs.  Questions were answered to the patient's satisfaction.     Pedro Earls

## 2020-11-23 NOTE — Transfer of Care (Signed)
Immediate Anesthesia Transfer of Care Note  Patient: Brittney Zimmerman  Procedure(s) Performed: RIGHT RADIOACTIVE SEED GUIDED BREAST LUMPECTOMY (Right Breast)  Patient Location: PACU  Anesthesia Type:General  Level of Consciousness: drowsy  Airway & Oxygen Therapy: Patient Spontanous Breathing and Patient connected to face mask oxygen  Post-op Assessment: Report given to RN and Post -op Vital signs reviewed and stable  Post vital signs: Reviewed and stable  Last Vitals:  Vitals Value Taken Time  BP 114/72 11/23/20 0905  Temp    Pulse 65 11/23/20 0907  Resp 8 11/23/20 0907  SpO2 99 % 11/23/20 0907  Vitals shown include unvalidated device data.  Last Pain:  Vitals:   11/23/20 0631  TempSrc: Oral  PainSc: 0-No pain      Patients Stated Pain Goal: 2 (05/27[birth year] 1856)  Complications: No complications documented.

## 2020-11-23 NOTE — Anesthesia Preprocedure Evaluation (Signed)
Anesthesia Evaluation  Patient identified by MRN, date of birth, ID band Patient awake    Reviewed: Allergy & Precautions, H&P , NPO status , Patient's Chart, lab work & pertinent test results  Airway Mallampati: II  TM Distance: >3 FB Neck ROM: Full    Dental no notable dental hx.    Pulmonary neg pulmonary ROS,    Pulmonary exam normal breath sounds clear to auscultation       Cardiovascular negative cardio ROS Normal cardiovascular exam Rhythm:Regular Rate:Normal     Neuro/Psych negative neurological ROS  negative psych ROS   GI/Hepatic negative GI ROS, Neg liver ROS,   Endo/Other  Morbid obesity  Renal/GU negative Renal ROS  negative genitourinary   Musculoskeletal negative musculoskeletal ROS (+)   Abdominal (+) + obese,   Peds negative pediatric ROS (+)  Hematology negative hematology ROS (+)   Anesthesia Other Findings   Reproductive/Obstetrics negative OB ROS                             Anesthesia Physical Anesthesia Plan  ASA: III  Anesthesia Plan: General   Post-op Pain Management:    Induction: Intravenous  PONV Risk Score and Plan: 3 and Ondansetron, Dexamethasone, Midazolam and Treatment may vary due to age or medical condition  Airway Management Planned: LMA  Additional Equipment:   Intra-op Plan:   Post-operative Plan: Extubation in OR  Informed Consent: I have reviewed the patients History and Physical, chart, labs and discussed the procedure including the risks, benefits and alternatives for the proposed anesthesia with the patient or authorized representative who has indicated his/her understanding and acceptance.     Dental advisory given  Plan Discussed with: CRNA  Anesthesia Plan Comments:         Anesthesia Quick Evaluation

## 2020-11-26 ENCOUNTER — Encounter (HOSPITAL_BASED_OUTPATIENT_CLINIC_OR_DEPARTMENT_OTHER): Payer: Self-pay | Admitting: Surgery

## 2020-11-26 LAB — SURGICAL PATHOLOGY

## 2022-01-06 IMAGING — MG MM PLC BREAST LOC DEV 1ST LESION INC*R*
7 series · 7 of 7 positions shown · non-contrast
Comparison: Previous exam(s).

CLINICAL DATA: Localization prior to surgery

EXAM:
MAMMOGRAPHIC GUIDED RADIOACTIVE SEED LOCALIZATION OF THE RIGHT
BREAST

[R ML (1 of 3)]
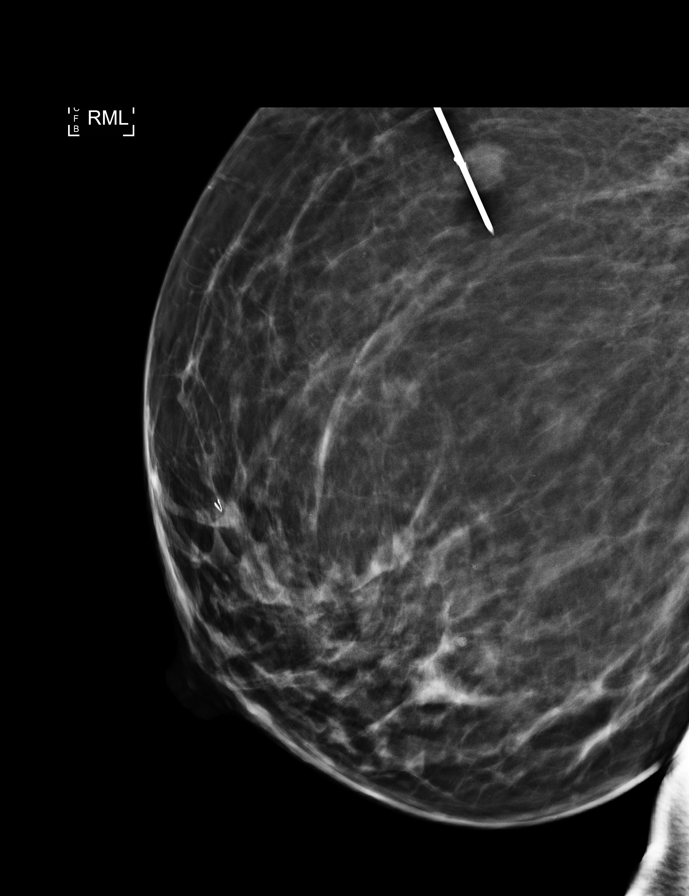

[R CC (1 of 4)]
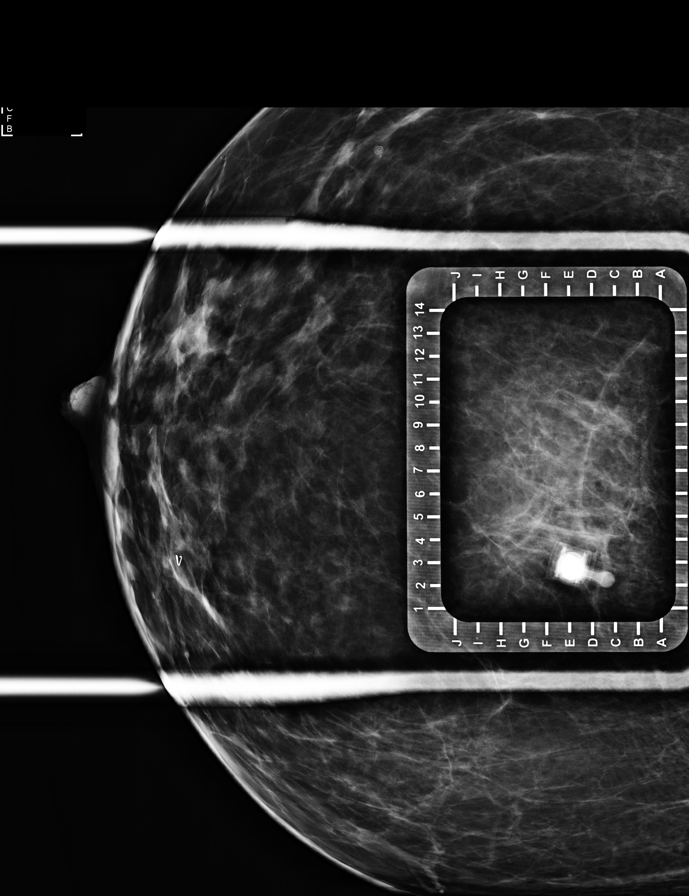

[R CC (2 of 4)]
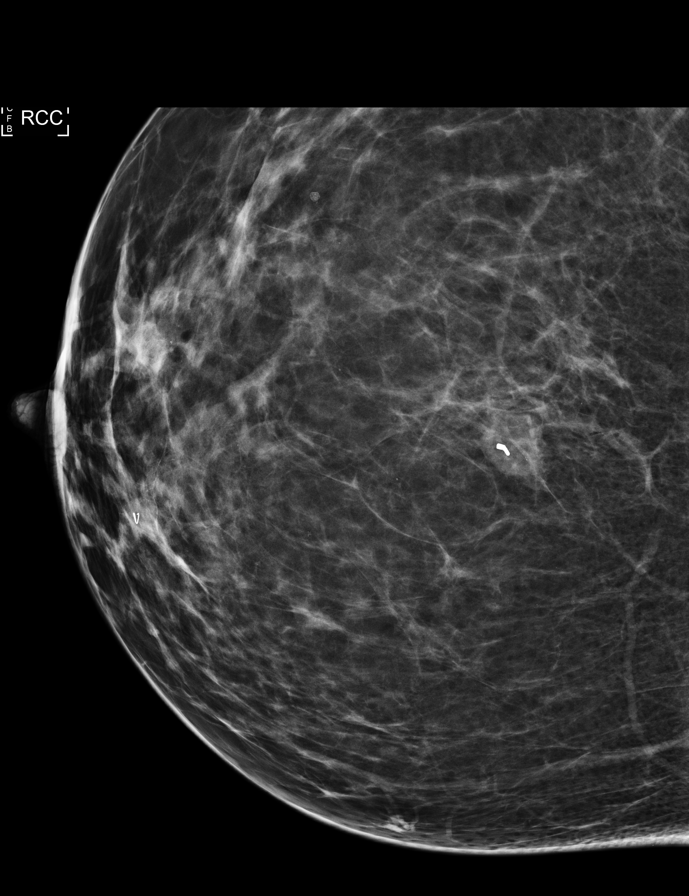

[R CC (3 of 4)]
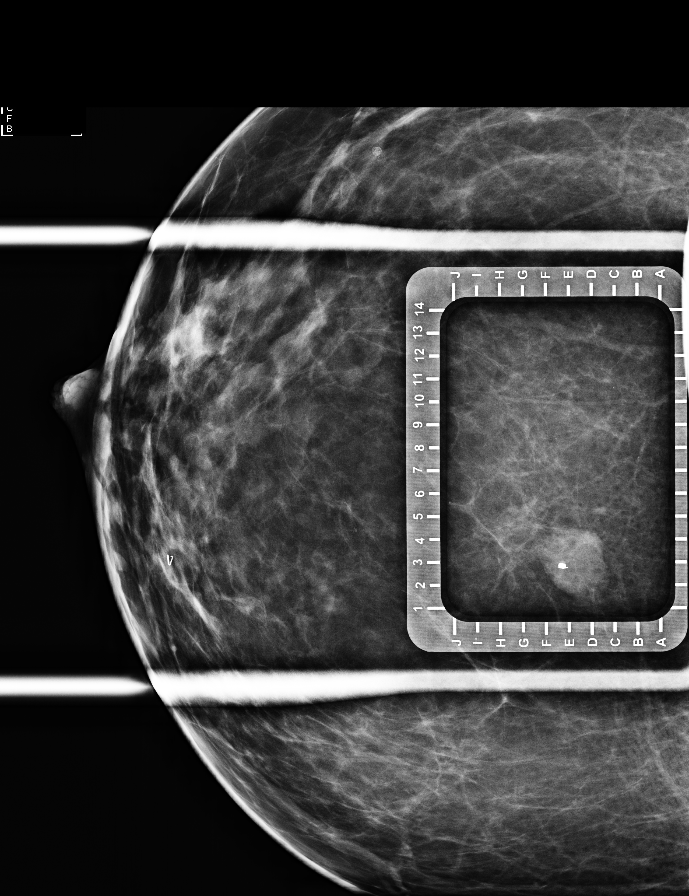

[R ML (2 of 3)]
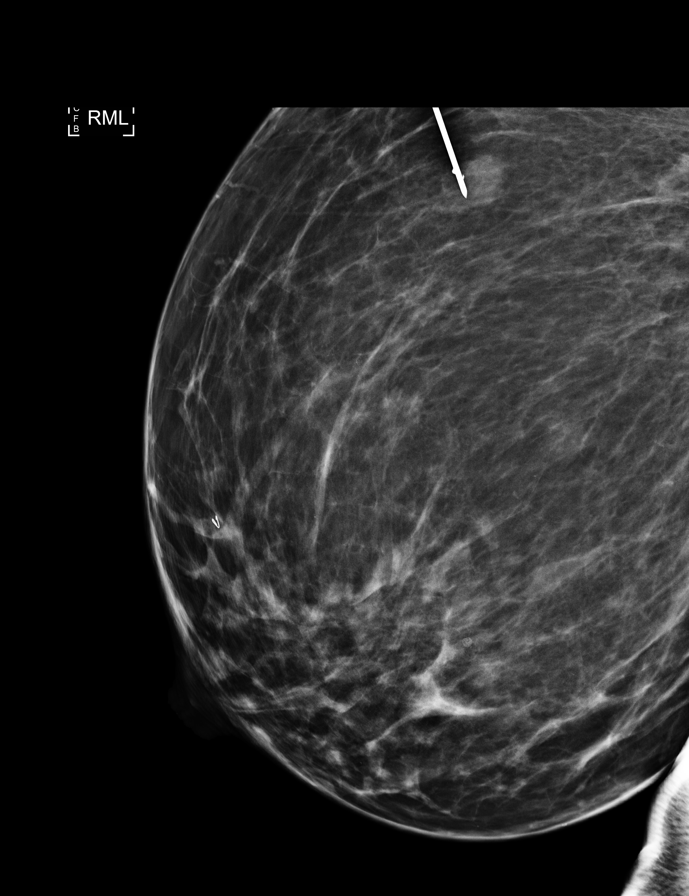

[R CC (4 of 4)]
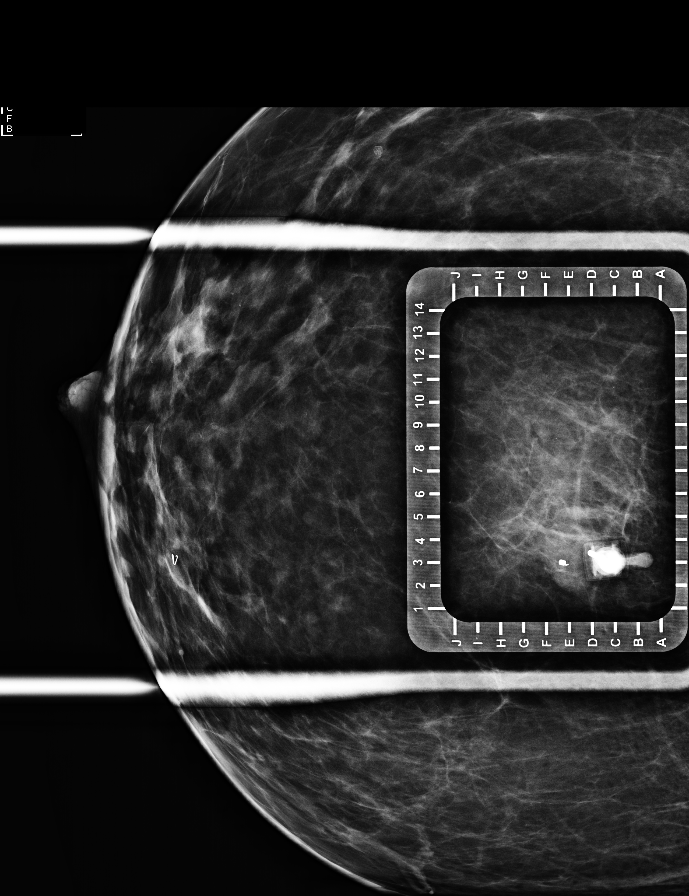

[R ML (3 of 3)]
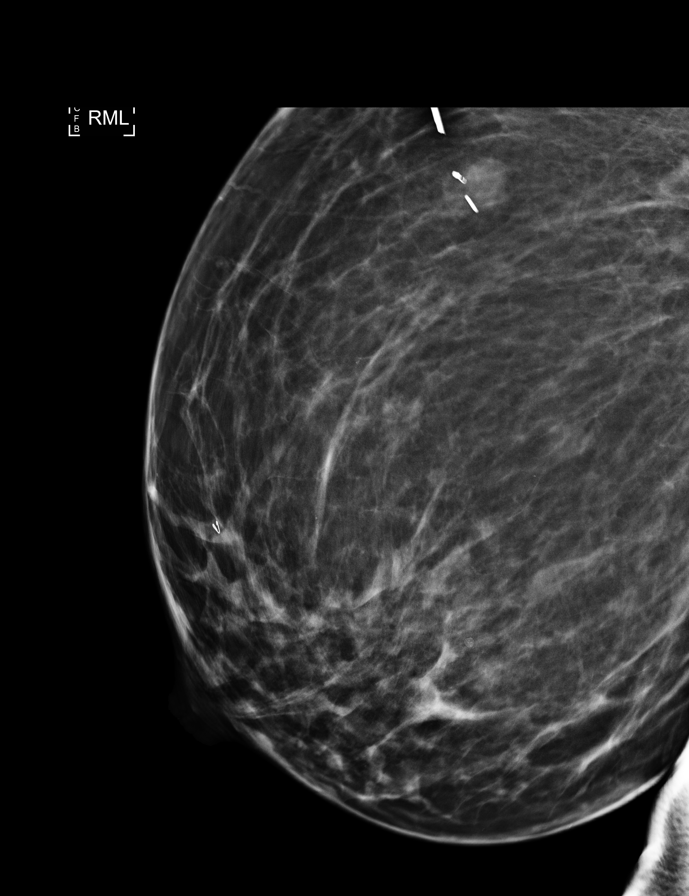

[7 of 7 positions shown; findings below may reference images not displayed]



The usual time-out protocol was performed immediately prior to the
procedure.

Using mammographic guidance, sterile technique, 1% lidocaine and an
E-P3C radioactive seed, the mass containing a coil shaped clip was
localized using a superior approach. The follow-up mammogram images
confirm the seed in the expected location and were marked for the
surgeon.

Follow-up survey of the patient confirms presence of the radioactive
seed.

Order number of E-P3C seed:  [id number].

Total activity:  0.252 millicuries reference Date: November 16, 2020

The patient tolerated the procedure well and was released from the
[REDACTED]. She was given instructions regarding seed removal.
IMPRESSION: Radioactive seed localization right breast. No apparent
complications.

## 2022-01-07 IMAGING — MG MM BREAST SURGICAL SPECIMEN
1 series · 2 of 2 positions shown · non-contrast
Comparison: Previous exam(s).

CLINICAL DATA: Evaluate specimen

EXAM:
SPECIMEN RADIOGRAPH OF THE right BREAST

[Series 2: R · right · 0.07mm/px · 2 of 2 slices shown]
[im 1/2]
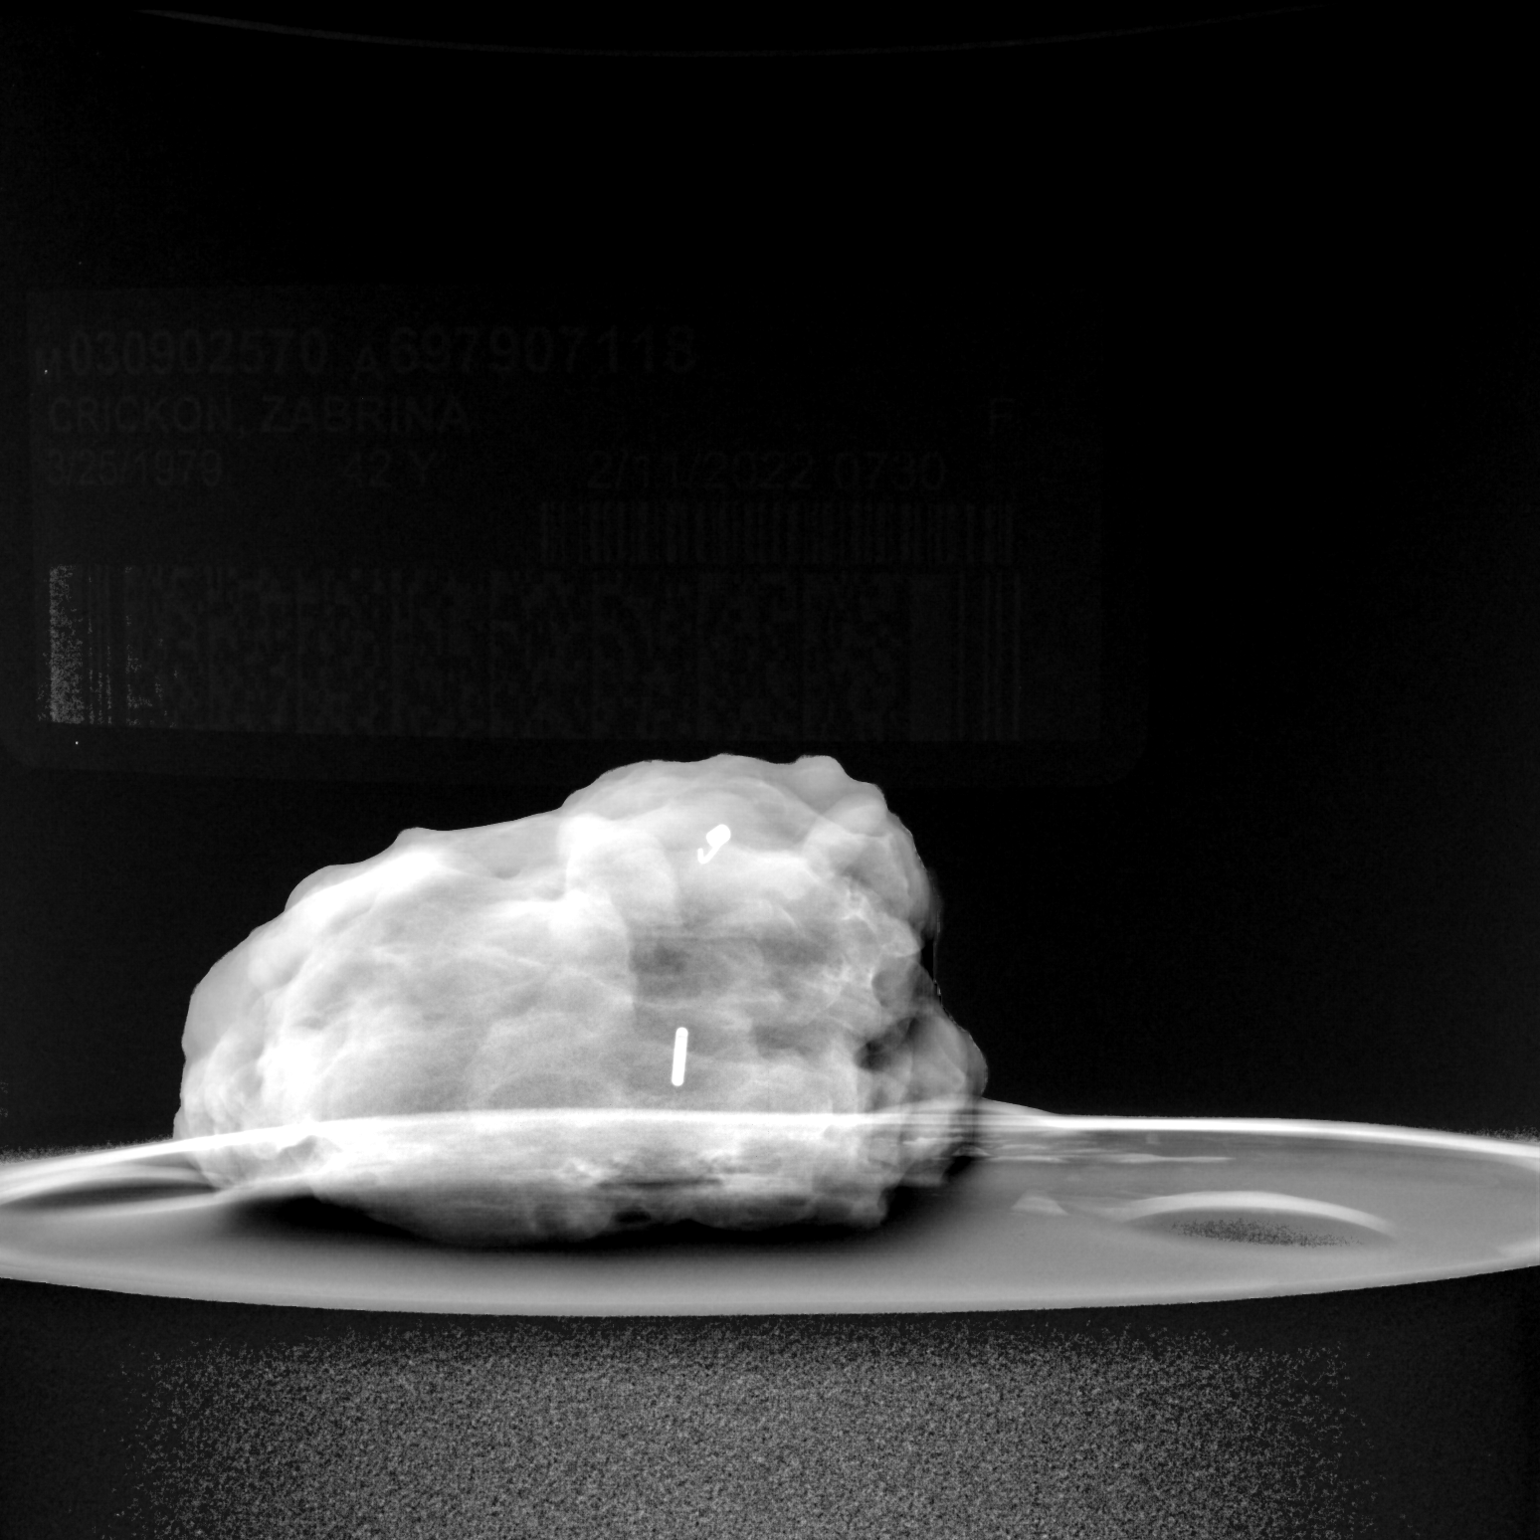
[im 2/2]
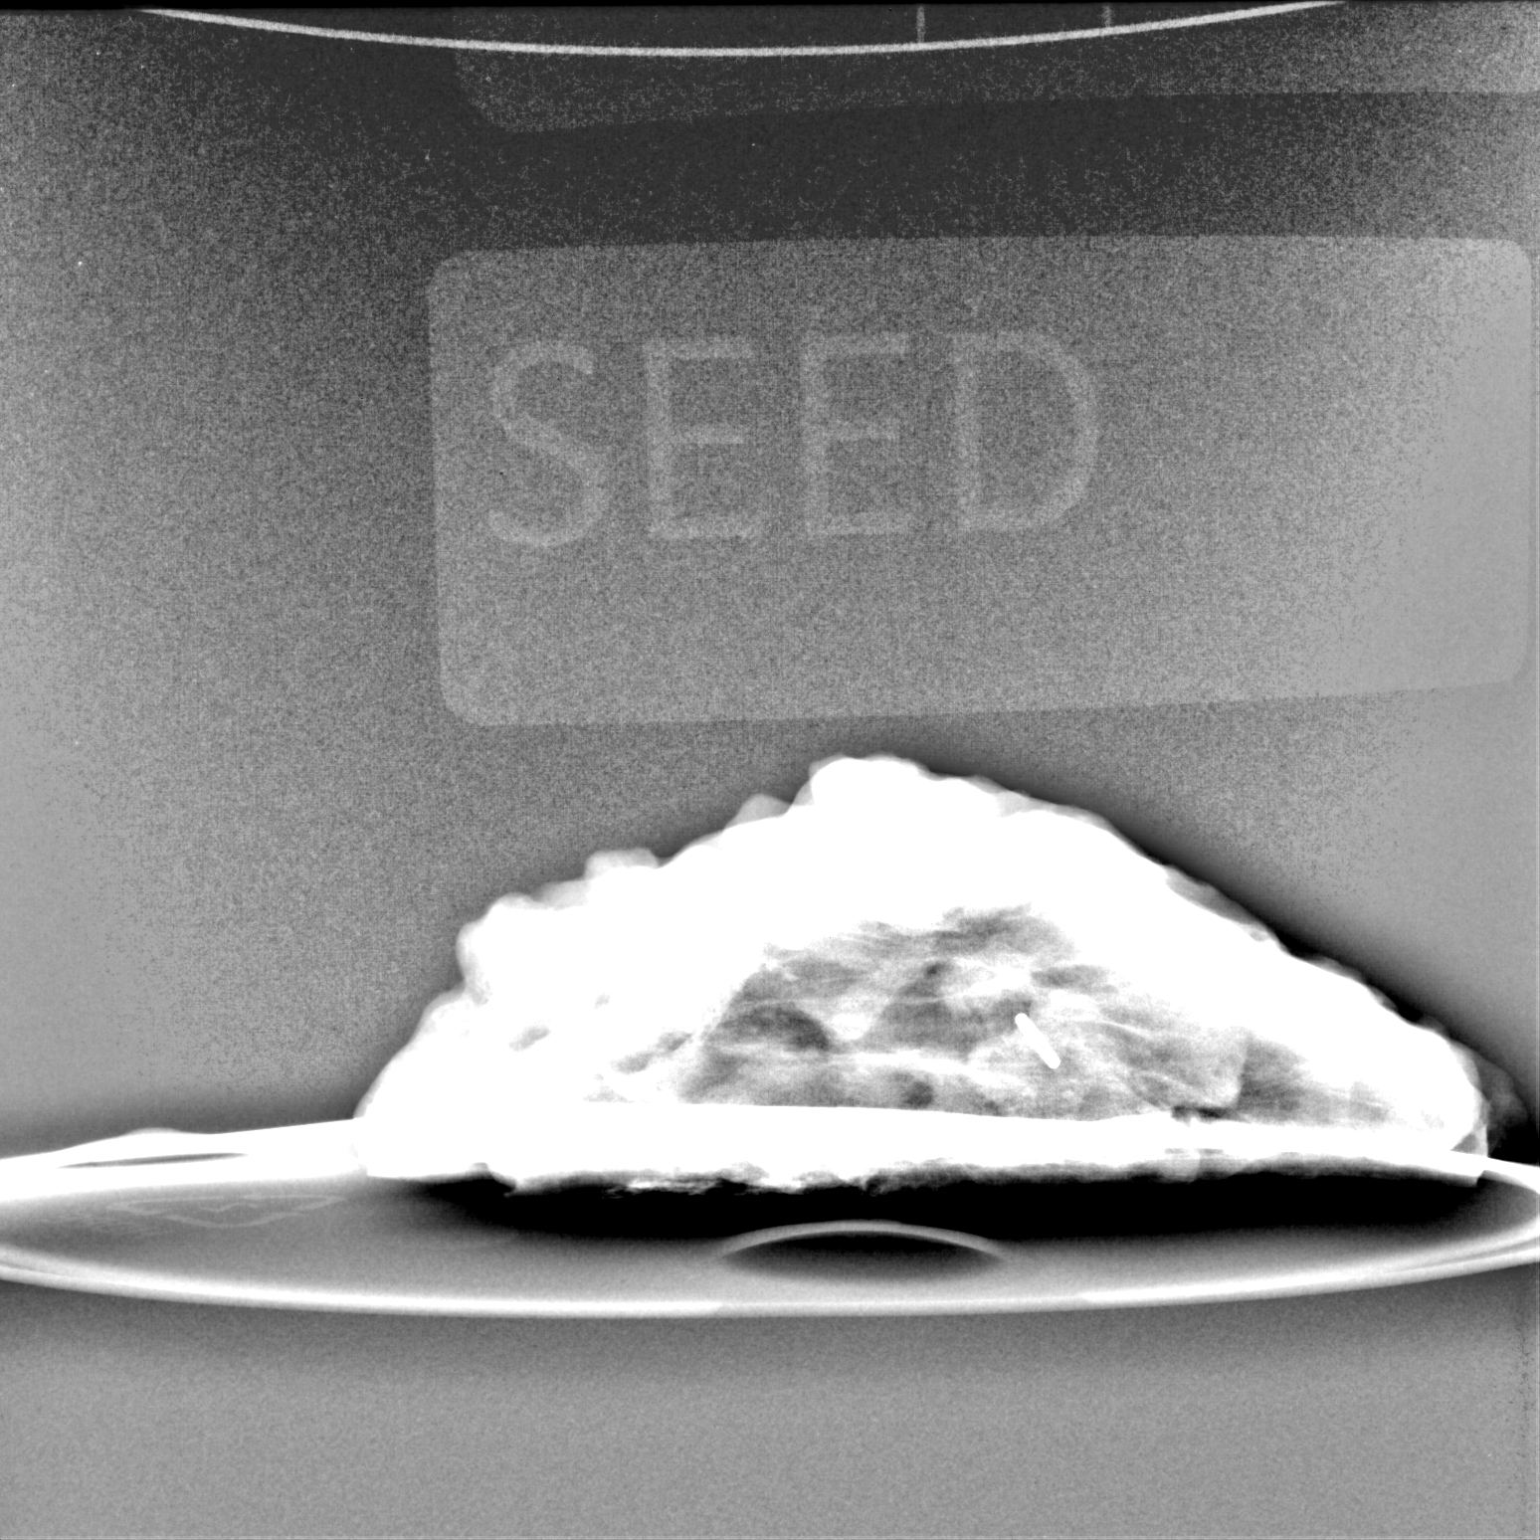

[2 of 2 positions shown; findings below may reference images not displayed]

FINDINGS: Status post excision of the right breast. The radioactive seed and
biopsy marker clip are present, completely intact, and were marked
for pathology.
IMPRESSION: Specimen radiograph of the right breast.

## 2022-02-18 ENCOUNTER — Encounter (HOSPITAL_COMMUNITY): Payer: Self-pay

## 2022-08-18 ENCOUNTER — Other Ambulatory Visit: Payer: Self-pay | Admitting: Family Medicine

## 2022-08-18 DIAGNOSIS — Z1231 Encounter for screening mammogram for malignant neoplasm of breast: Secondary | ICD-10-CM

## 2022-09-29 ENCOUNTER — Encounter: Payer: Self-pay | Admitting: Podiatry

## 2022-09-29 ENCOUNTER — Ambulatory Visit (INDEPENDENT_AMBULATORY_CARE_PROVIDER_SITE_OTHER): Payer: 59 | Admitting: Podiatry

## 2022-09-29 DIAGNOSIS — L600 Ingrowing nail: Secondary | ICD-10-CM | POA: Diagnosis not present

## 2022-09-29 MED ORDER — NEOMYCIN-POLYMYXIN-HC 1 % OT SOLN: OTIC | 1 refills | Status: AC

## 2022-09-29 NOTE — Patient Instructions (Signed)

## 2022-09-29 NOTE — Progress Notes (Signed)
She presents today after having not seen her for couple years with a chief complaint of ingrown toenail tibial fibular border of the hallux left.  She says been like this for past few months now medial side seems to be worse than the lateral side.  Objective: Vital signs are stable alert and oriented x 3.  There is mild erythema to the tibial-fibular border there is no purulence no malodor there is just tenderness on palpation of the left hallux.  Assessment: Ingrown nail tibial-fibular border hallux left.  Plan: Chemical matricectomy was performed today after local anesthetic was administered.  Tolerated procedure well without complications.  Given both oral written home-going instructions as well as a prescription for Cortisporin Otic to be applied twice daily after soaking.

## 2022-10-14 ENCOUNTER — Ambulatory Visit
Admission: RE | Admit: 2022-10-14 | Discharge: 2022-10-14 | Disposition: A | Discharge status: Home / Self Care | Payer: 59 | Source: Ambulatory Visit | Attending: Family Medicine | Admitting: Family Medicine

## 2022-10-14 DIAGNOSIS — Z1231 Encounter for screening mammogram for malignant neoplasm of breast: Secondary | ICD-10-CM | POA: Insufficient documentation

## 2022-10-15 ENCOUNTER — Encounter: Payer: Self-pay | Admitting: Podiatry

## 2022-10-15 ENCOUNTER — Ambulatory Visit (INDEPENDENT_AMBULATORY_CARE_PROVIDER_SITE_OTHER): Payer: 59 | Admitting: Podiatry

## 2022-10-15 DIAGNOSIS — L600 Ingrowing nail: Secondary | ICD-10-CM

## 2022-10-15 DIAGNOSIS — Z9889 Other specified postprocedural states: Secondary | ICD-10-CM

## 2022-10-15 NOTE — Progress Notes (Signed)
She presents today for nail check hallux left states that is doing great she is very happy with the outcome thus far.  There is no erythema edema cellulitis drainage or odor nail margins appear to be healing very nicely.  No signs of infection.  Assessment: Well-healing surgical toe.  Plan: Follow-up with me on an as-needed basis.

## 2023-09-02 ENCOUNTER — Other Ambulatory Visit: Payer: Self-pay | Admitting: Physician Assistant

## 2023-09-02 DIAGNOSIS — Z1231 Encounter for screening mammogram for malignant neoplasm of breast: Secondary | ICD-10-CM

## 2023-10-16 ENCOUNTER — Ambulatory Visit
Admission: RE | Admit: 2023-10-16 | Discharge: 2023-10-16 | Disposition: A | Discharge status: Home / Self Care | Payer: 59 | Source: Ambulatory Visit | Attending: Physician Assistant | Admitting: Physician Assistant

## 2023-10-16 DIAGNOSIS — Z1231 Encounter for screening mammogram for malignant neoplasm of breast: Secondary | ICD-10-CM | POA: Diagnosis present
